# Patient Record
Sex: Female | Born: 1960 | Race: Black or African American | Hispanic: No | Marital: Married
Health system: Southern US, Community
[De-identification: ages and names within clinical notes are randomized; demographics above are authoritative.]

---

## 1998-10-11 ENCOUNTER — Emergency Department (HOSPITAL_COMMUNITY): Admission: EM | Admit: 1998-10-11 | Discharge: 1998-10-11 | Payer: Self-pay | Admitting: Emergency Medicine

## 2000-10-20 ENCOUNTER — Other Ambulatory Visit: Admission: RE | Admit: 2000-10-20 | Discharge: 2000-10-20 | Payer: Self-pay | Admitting: Gynecology

## 2002-07-12 ENCOUNTER — Encounter: Payer: Self-pay | Admitting: Gynecology

## 2002-07-12 ENCOUNTER — Encounter: Admission: RE | Admit: 2002-07-12 | Discharge: 2002-07-12 | Payer: Self-pay | Admitting: Gynecology

## 2002-10-21 ENCOUNTER — Encounter: Payer: Self-pay | Admitting: Emergency Medicine

## 2002-10-21 ENCOUNTER — Emergency Department (HOSPITAL_COMMUNITY): Admission: EM | Admit: 2002-10-21 | Discharge: 2002-10-21 | Payer: Self-pay | Admitting: Emergency Medicine

## 2003-11-01 ENCOUNTER — Emergency Department (HOSPITAL_COMMUNITY): Admission: EM | Admit: 2003-11-01 | Discharge: 2003-11-01 | Payer: Self-pay | Admitting: *Deleted

## 2004-04-06 ENCOUNTER — Encounter: Admission: RE | Admit: 2004-04-06 | Discharge: 2004-04-06 | Payer: Self-pay | Admitting: Family Medicine

## 2004-07-06 ENCOUNTER — Encounter: Admission: RE | Admit: 2004-07-06 | Discharge: 2004-07-06 | Payer: Self-pay | Admitting: Family Medicine

## 2007-09-18 ENCOUNTER — Other Ambulatory Visit: Admission: RE | Admit: 2007-09-18 | Discharge: 2007-09-18 | Payer: Self-pay | Admitting: Family Medicine

## 2009-07-30 ENCOUNTER — Inpatient Hospital Stay (HOSPITAL_COMMUNITY): Admission: AD | Admit: 2009-07-30 | Discharge: 2009-07-30 | Payer: Self-pay | Admitting: Family Medicine

## 2009-07-30 ENCOUNTER — Other Ambulatory Visit: Payer: Self-pay | Admitting: Emergency Medicine

## 2010-07-27 ENCOUNTER — Encounter
Admission: RE | Admit: 2010-07-27 | Discharge: 2010-07-27 | Payer: Self-pay | Source: Home / Self Care | Attending: Family Medicine | Admitting: Family Medicine

## 2010-09-08 ENCOUNTER — Other Ambulatory Visit: Payer: Self-pay | Admitting: Gynecology

## 2010-11-09 LAB — DIFFERENTIAL
Basophils Absolute: 0 10*3/uL (ref 0.0–0.1)
Basophils Relative: 1 % (ref 0–1)
Eosinophils Absolute: 0 10*3/uL (ref 0.0–0.7)
Eosinophils Relative: 0 % (ref 0–5)
Lymphocytes Relative: 8 % — ABNORMAL LOW (ref 12–46)
Monocytes Absolute: 0.4 10*3/uL (ref 0.1–1.0)

## 2010-11-09 LAB — COMPREHENSIVE METABOLIC PANEL
ALT: 16 U/L (ref 0–35)
AST: 18 U/L (ref 0–37)
Alkaline Phosphatase: 44 U/L (ref 39–117)
CO2: 25 mEq/L (ref 19–32)
Calcium: 8.8 mg/dL (ref 8.4–10.5)
Chloride: 104 mEq/L (ref 96–112)
GFR calc non Af Amer: >60 mL/min (ref >60)
Glucose, Bld: 108 mg/dL — ABNORMAL HIGH (ref 70–99)
Sodium: 136 mEq/L (ref 135–145)
Total Bilirubin: 0.6 mg/dL (ref 0.3–1.2)

## 2010-11-09 LAB — CBC
MCV: 91.1 fL (ref 78.0–100.0)
Platelets: 328 10*3/uL (ref 150–400)
RBC: 3.96 MIL/uL (ref 3.87–5.11)
WBC: 6.4 10*3/uL (ref 4.0–10.5)

## 2010-11-09 LAB — POCT PREGNANCY, URINE

## 2010-11-09 LAB — URINALYSIS, ROUTINE W REFLEX MICROSCOPIC
Glucose, UA: NEGATIVE mg/dL
Ketones, ur: >80 mg/dL — AB
Protein, ur: NEGATIVE mg/dL
Urobilinogen, UA: 1 mg/dL (ref 0.0–1.0)

## 2010-11-09 LAB — GC/CHLAMYDIA PROBE AMP, GENITAL

## 2010-11-09 LAB — WET PREP, GENITAL
Clue Cells Wet Prep HPF POC: NONE SEEN
Trich, Wet Prep: NONE SEEN
WBC, Wet Prep HPF POC: NONE SEEN

## 2010-11-09 LAB — LIPASE, BLOOD: Lipase: 25 U/L (ref 11–59)

## 2011-07-27 ENCOUNTER — Other Ambulatory Visit: Payer: Self-pay | Admitting: Family Medicine

## 2011-07-27 DIAGNOSIS — Z1231 Encounter for screening mammogram for malignant neoplasm of breast: Secondary | ICD-10-CM

## 2011-08-04 ENCOUNTER — Ambulatory Visit
Admission: RE | Admit: 2011-08-04 | Discharge: 2011-08-04 | Disposition: A | Discharge status: Home / Self Care | Payer: Private Health Insurance - Indemnity | Source: Ambulatory Visit | Attending: Family Medicine | Admitting: Family Medicine

## 2011-08-04 DIAGNOSIS — Z1231 Encounter for screening mammogram for malignant neoplasm of breast: Secondary | ICD-10-CM

## 2011-12-28 ENCOUNTER — Other Ambulatory Visit: Payer: Self-pay | Admitting: Gynecology

## 2012-08-07 ENCOUNTER — Other Ambulatory Visit: Payer: Self-pay | Admitting: Family Medicine

## 2012-08-07 DIAGNOSIS — Z1231 Encounter for screening mammogram for malignant neoplasm of breast: Secondary | ICD-10-CM

## 2012-09-04 ENCOUNTER — Ambulatory Visit
Admission: RE | Admit: 2012-09-04 | Discharge: 2012-09-04 | Disposition: A | Discharge status: Home / Self Care | Payer: Private Health Insurance - Indemnity | Source: Ambulatory Visit | Attending: Family Medicine | Admitting: Family Medicine

## 2012-09-04 DIAGNOSIS — Z1231 Encounter for screening mammogram for malignant neoplasm of breast: Secondary | ICD-10-CM

## 2013-11-29 ENCOUNTER — Other Ambulatory Visit (HOSPITAL_COMMUNITY)
Admission: RE | Admit: 2013-11-29 | Discharge: 2013-11-29 | Disposition: A | Discharge status: Home / Self Care | Payer: BC Managed Care – PPO | Source: Ambulatory Visit | Attending: Family Medicine | Admitting: Family Medicine

## 2013-11-29 ENCOUNTER — Other Ambulatory Visit: Payer: Self-pay | Admitting: Family Medicine

## 2013-11-29 DIAGNOSIS — Z124 Encounter for screening for malignant neoplasm of cervix: Secondary | ICD-10-CM | POA: Insufficient documentation

## 2014-01-22 ENCOUNTER — Other Ambulatory Visit: Payer: Self-pay

## 2014-01-22 DIAGNOSIS — Z1231 Encounter for screening mammogram for malignant neoplasm of breast: Secondary | ICD-10-CM

## 2014-03-28 ENCOUNTER — Ambulatory Visit: Payer: Private Health Insurance - Indemnity

## 2014-04-30 ENCOUNTER — Ambulatory Visit: Payer: Private Health Insurance - Indemnity

## 2014-06-26 ENCOUNTER — Ambulatory Visit: Payer: Private Health Insurance - Indemnity

## 2014-09-11 ENCOUNTER — Ambulatory Visit
Admission: RE | Admit: 2014-09-11 | Discharge: 2014-09-11 | Disposition: A | Discharge status: Home / Self Care | Payer: BLUE CROSS/BLUE SHIELD | Source: Ambulatory Visit

## 2014-09-11 DIAGNOSIS — Z1231 Encounter for screening mammogram for malignant neoplasm of breast: Secondary | ICD-10-CM

## 2015-11-18 ENCOUNTER — Other Ambulatory Visit: Payer: Self-pay

## 2015-11-18 DIAGNOSIS — Z1231 Encounter for screening mammogram for malignant neoplasm of breast: Secondary | ICD-10-CM

## 2015-12-10 ENCOUNTER — Ambulatory Visit: Payer: BLUE CROSS/BLUE SHIELD

## 2015-12-17 ENCOUNTER — Ambulatory Visit
Admission: RE | Admit: 2015-12-17 | Discharge: 2015-12-17 | Disposition: A | Discharge status: Home / Self Care | Payer: BLUE CROSS/BLUE SHIELD | Source: Ambulatory Visit

## 2015-12-17 DIAGNOSIS — Z1231 Encounter for screening mammogram for malignant neoplasm of breast: Secondary | ICD-10-CM

## 2016-04-02 DIAGNOSIS — R42 Dizziness and giddiness: Secondary | ICD-10-CM | POA: Diagnosis not present

## 2016-04-02 DIAGNOSIS — I10 Essential (primary) hypertension: Secondary | ICD-10-CM | POA: Diagnosis not present

## 2016-10-05 DIAGNOSIS — I10 Essential (primary) hypertension: Secondary | ICD-10-CM | POA: Diagnosis not present

## 2016-10-05 DIAGNOSIS — Z0000 Encounter for general adult medical examination without abnormal findings: Secondary | ICD-10-CM | POA: Diagnosis not present

## 2017-01-14 DIAGNOSIS — Z1211 Encounter for screening for malignant neoplasm of colon: Secondary | ICD-10-CM | POA: Diagnosis not present

## 2017-01-14 DIAGNOSIS — K6389 Other specified diseases of intestine: Secondary | ICD-10-CM | POA: Diagnosis not present

## 2017-01-14 DIAGNOSIS — K640 First degree hemorrhoids: Secondary | ICD-10-CM | POA: Diagnosis not present

## 2017-02-22 ENCOUNTER — Other Ambulatory Visit: Payer: Self-pay | Admitting: Family Medicine

## 2017-02-22 DIAGNOSIS — Z1231 Encounter for screening mammogram for malignant neoplasm of breast: Secondary | ICD-10-CM

## 2017-03-29 ENCOUNTER — Ambulatory Visit
Admission: RE | Admit: 2017-03-29 | Discharge: 2017-03-29 | Disposition: A | Discharge status: Home / Self Care | Payer: BLUE CROSS/BLUE SHIELD | Source: Ambulatory Visit | Attending: Family Medicine | Admitting: Family Medicine

## 2017-03-29 DIAGNOSIS — Z1231 Encounter for screening mammogram for malignant neoplasm of breast: Secondary | ICD-10-CM

## 2017-07-08 DIAGNOSIS — Z23 Encounter for immunization: Secondary | ICD-10-CM | POA: Diagnosis not present

## 2017-10-06 ENCOUNTER — Other Ambulatory Visit: Payer: Self-pay | Admitting: Family Medicine

## 2017-10-06 ENCOUNTER — Other Ambulatory Visit (HOSPITAL_COMMUNITY)
Admission: RE | Admit: 2017-10-06 | Discharge: 2017-10-06 | Disposition: A | Discharge status: Home / Self Care | Payer: BLUE CROSS/BLUE SHIELD | Source: Ambulatory Visit | Attending: Family Medicine | Admitting: Family Medicine

## 2017-10-06 DIAGNOSIS — Z0000 Encounter for general adult medical examination without abnormal findings: Secondary | ICD-10-CM | POA: Diagnosis not present

## 2017-10-06 DIAGNOSIS — I10 Essential (primary) hypertension: Secondary | ICD-10-CM | POA: Diagnosis not present

## 2017-10-06 DIAGNOSIS — Z124 Encounter for screening for malignant neoplasm of cervix: Secondary | ICD-10-CM | POA: Diagnosis not present

## 2017-10-10 LAB — CYTOLOGY - PAP: Diagnosis: NEGATIVE

## 2018-04-11 ENCOUNTER — Other Ambulatory Visit: Payer: Self-pay | Admitting: Family Medicine

## 2018-04-11 DIAGNOSIS — Z1231 Encounter for screening mammogram for malignant neoplasm of breast: Secondary | ICD-10-CM

## 2018-05-08 ENCOUNTER — Other Ambulatory Visit: Payer: Self-pay | Admitting: Family Medicine

## 2018-05-08 ENCOUNTER — Ambulatory Visit
Admission: RE | Admit: 2018-05-08 | Discharge: 2018-05-08 | Disposition: A | Discharge status: Home / Self Care | Payer: BLUE CROSS/BLUE SHIELD | Source: Ambulatory Visit | Attending: Family Medicine | Admitting: Family Medicine

## 2018-05-08 DIAGNOSIS — Z1231 Encounter for screening mammogram for malignant neoplasm of breast: Secondary | ICD-10-CM | POA: Diagnosis not present

## 2018-07-11 DIAGNOSIS — Z23 Encounter for immunization: Secondary | ICD-10-CM | POA: Diagnosis not present

## 2018-08-14 ENCOUNTER — Emergency Department (HOSPITAL_COMMUNITY): Payer: No Typology Code available for payment source

## 2018-08-14 ENCOUNTER — Emergency Department (HOSPITAL_COMMUNITY)
Admission: EM | Admit: 2018-08-14 | Discharge: 2018-08-14 | Disposition: A | Discharge status: Home / Self Care | Payer: No Typology Code available for payment source | Attending: Emergency Medicine | Admitting: Emergency Medicine

## 2018-08-14 ENCOUNTER — Encounter (HOSPITAL_COMMUNITY): Payer: Self-pay | Admitting: *Deleted

## 2018-08-14 DIAGNOSIS — M791 Myalgia, unspecified site: Secondary | ICD-10-CM | POA: Diagnosis not present

## 2018-08-14 DIAGNOSIS — M549 Dorsalgia, unspecified: Secondary | ICD-10-CM

## 2018-08-14 DIAGNOSIS — M545 Low back pain: Secondary | ICD-10-CM | POA: Diagnosis not present

## 2018-08-14 DIAGNOSIS — S3992XA Unspecified injury of lower back, initial encounter: Secondary | ICD-10-CM | POA: Diagnosis not present

## 2018-08-14 MED ORDER — METHOCARBAMOL 500 MG PO TABS: 500.0000 mg | ORAL_TABLET | Freq: Two times a day (BID) | ORAL | 0 refills | Status: AC
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
## 2018-08-14 NOTE — ED Notes (Signed)
ED Provider at bedside. 
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
## 2018-08-14 NOTE — ED Notes (Signed)
Patient transported to X-ray 

## 2018-08-14 NOTE — Discharge Instructions (Signed)
Please read attached information. If you experience any new or worsening signs or symptoms please return to the emergency room for evaluation. Please follow-up with your primary care provider or specialist as discussed. Please use medication prescribed only as directed and discontinue taking if you have any concerning signs or symptoms.   °

## 2018-10-10 DIAGNOSIS — Z0000 Encounter for general adult medical examination without abnormal findings: Secondary | ICD-10-CM | POA: Diagnosis not present

## 2018-10-10 DIAGNOSIS — Z1159 Encounter for screening for other viral diseases: Secondary | ICD-10-CM | POA: Diagnosis not present

## 2018-10-10 DIAGNOSIS — I10 Essential (primary) hypertension: Secondary | ICD-10-CM | POA: Diagnosis not present

## 2019-01-29 ENCOUNTER — Other Ambulatory Visit: Payer: Self-pay | Admitting: *Deleted

## 2019-01-29 DIAGNOSIS — Z20822 Contact with and (suspected) exposure to covid-19: Secondary | ICD-10-CM

## 2019-02-05 ENCOUNTER — Telehealth: Payer: Self-pay

## 2019-02-05 LAB — NOVEL CORONAVIRUS, NAA: SARS-CoV-2, NAA: NOT DETECTED

## 2019-02-05 NOTE — Telephone Encounter (Signed)
Pt. Called - given negative COVID 19 results.

## 2019-04-18 ENCOUNTER — Other Ambulatory Visit: Payer: Self-pay | Admitting: Family Medicine

## 2019-04-18 DIAGNOSIS — Z1231 Encounter for screening mammogram for malignant neoplasm of breast: Secondary | ICD-10-CM

## 2019-06-02 DIAGNOSIS — Z23 Encounter for immunization: Secondary | ICD-10-CM | POA: Diagnosis not present

## 2019-06-04 ENCOUNTER — Ambulatory Visit: Payer: BLUE CROSS/BLUE SHIELD

## 2019-06-08 ENCOUNTER — Ambulatory Visit: Payer: BLUE CROSS/BLUE SHIELD

## 2019-06-12 ENCOUNTER — Ambulatory Visit
Admission: RE | Admit: 2019-06-12 | Discharge: 2019-06-12 | Disposition: A | Discharge status: Home / Self Care | Payer: BC Managed Care – PPO | Source: Ambulatory Visit | Attending: Family Medicine | Admitting: Family Medicine

## 2019-06-12 ENCOUNTER — Other Ambulatory Visit: Payer: Self-pay

## 2019-06-12 DIAGNOSIS — Z1231 Encounter for screening mammogram for malignant neoplasm of breast: Secondary | ICD-10-CM

## 2019-06-14 ENCOUNTER — Other Ambulatory Visit: Payer: Self-pay | Admitting: Family Medicine

## 2019-06-14 DIAGNOSIS — N63 Unspecified lump in unspecified breast: Secondary | ICD-10-CM

## 2019-06-28 ENCOUNTER — Other Ambulatory Visit: Payer: Self-pay | Admitting: Cardiology

## 2019-06-28 DIAGNOSIS — Z20822 Contact with and (suspected) exposure to covid-19: Secondary | ICD-10-CM

## 2019-07-02 LAB — NOVEL CORONAVIRUS, NAA: SARS-CoV-2, NAA: NOT DETECTED

## 2019-07-03 ENCOUNTER — Ambulatory Visit
Admission: RE | Admit: 2019-07-03 | Discharge: 2019-07-03 | Disposition: A | Discharge status: Home / Self Care | Payer: BC Managed Care – PPO | Source: Ambulatory Visit | Attending: Family Medicine | Admitting: Family Medicine

## 2019-07-03 ENCOUNTER — Other Ambulatory Visit: Payer: Self-pay

## 2019-07-03 DIAGNOSIS — N6489 Other specified disorders of breast: Secondary | ICD-10-CM | POA: Diagnosis not present

## 2019-07-03 DIAGNOSIS — N63 Unspecified lump in unspecified breast: Secondary | ICD-10-CM

## 2019-07-03 DIAGNOSIS — N6310 Unspecified lump in the right breast, unspecified quadrant: Secondary | ICD-10-CM | POA: Diagnosis not present

## 2019-07-03 DIAGNOSIS — R928 Other abnormal and inconclusive findings on diagnostic imaging of breast: Secondary | ICD-10-CM | POA: Diagnosis not present

## 2019-07-26 ENCOUNTER — Other Ambulatory Visit: Payer: Self-pay | Admitting: Cardiology

## 2019-07-26 DIAGNOSIS — Z20828 Contact with and (suspected) exposure to other viral communicable diseases: Secondary | ICD-10-CM | POA: Diagnosis not present

## 2019-07-26 DIAGNOSIS — Z20822 Contact with and (suspected) exposure to covid-19: Secondary | ICD-10-CM

## 2019-07-28 LAB — NOVEL CORONAVIRUS, NAA: SARS-CoV-2, NAA: NOT DETECTED

## 2019-08-18 ENCOUNTER — Other Ambulatory Visit: Payer: Self-pay

## 2019-08-18 DIAGNOSIS — Z20822 Contact with and (suspected) exposure to covid-19: Secondary | ICD-10-CM | POA: Diagnosis not present

## 2019-08-19 LAB — NOVEL CORONAVIRUS, NAA: SARS-CoV-2, NAA: NOT DETECTED

## 2019-10-11 DIAGNOSIS — Z0000 Encounter for general adult medical examination without abnormal findings: Secondary | ICD-10-CM | POA: Diagnosis not present

## 2019-10-11 DIAGNOSIS — I10 Essential (primary) hypertension: Secondary | ICD-10-CM | POA: Diagnosis not present

## 2020-04-02 IMAGING — MG DIGITAL DIAGNOSTIC BILAT W/ TOMO W/ CAD
8 of 17 series · 8 of 40 positions shown · non-contrast
Comparison: Previous exam(s).

CLINICAL DATA: 57-year-old female with palpable lump in the INNER
RIGHT breast discovered on self-examination. Also for annual
bilateral mammogram.

EXAM:
DIGITAL DIAGNOSTIC BILATERAL MAMMOGRAM WITH CAD AND TOMO
ULTRASOUND RIGHT BREAST

[R CC synth-2D (1 of 2)]
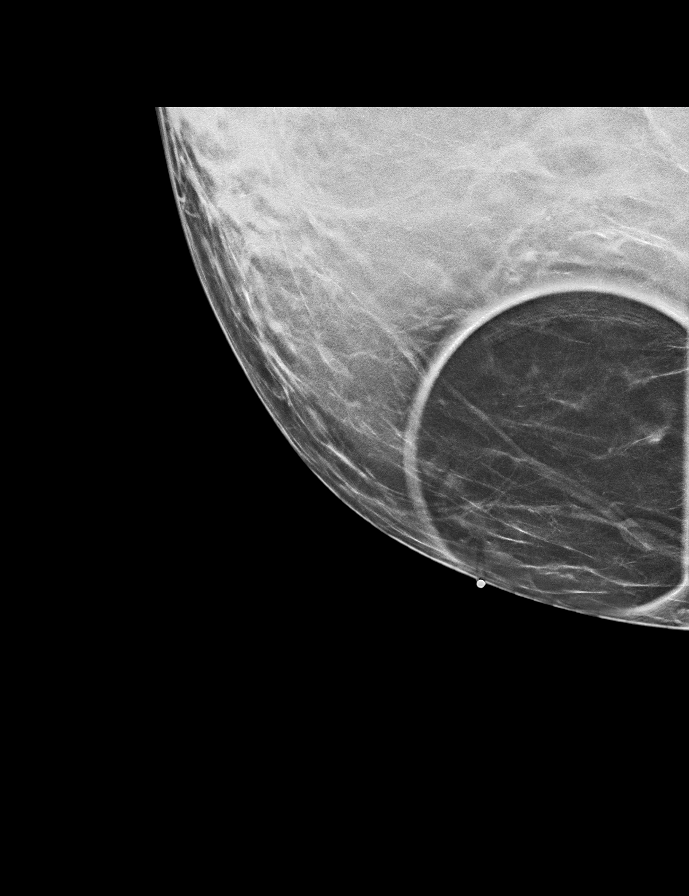

[L CV synth-2D]
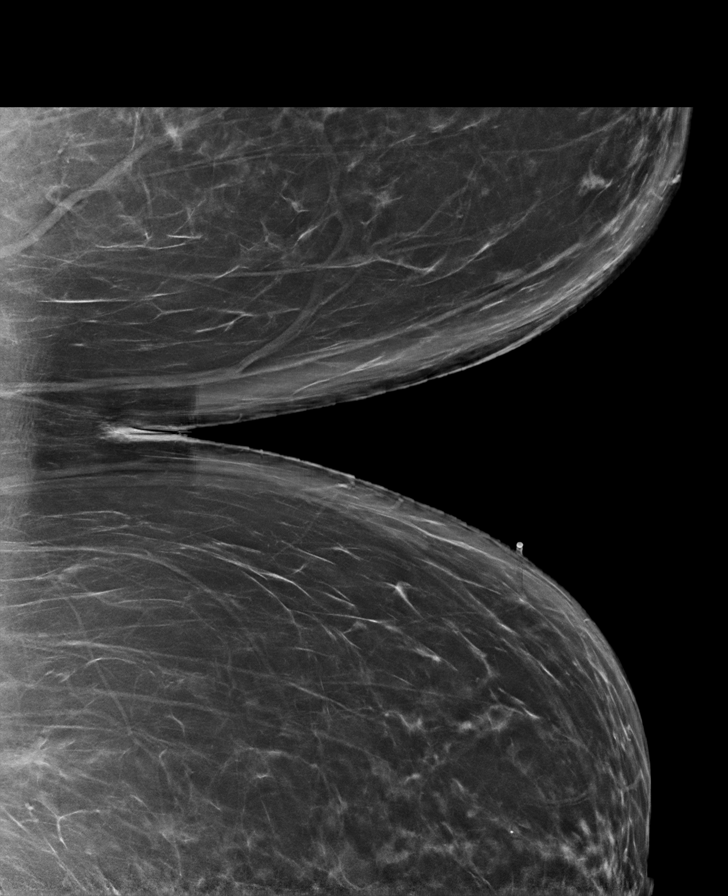

[R CC synth-2D (2 of 2)]
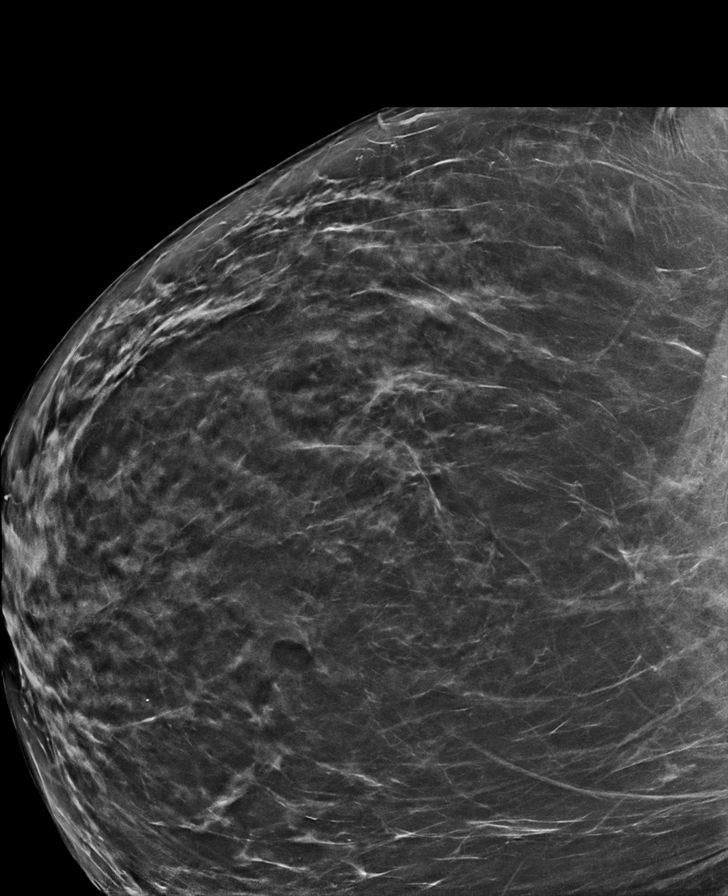

[L MLO synth-2D]
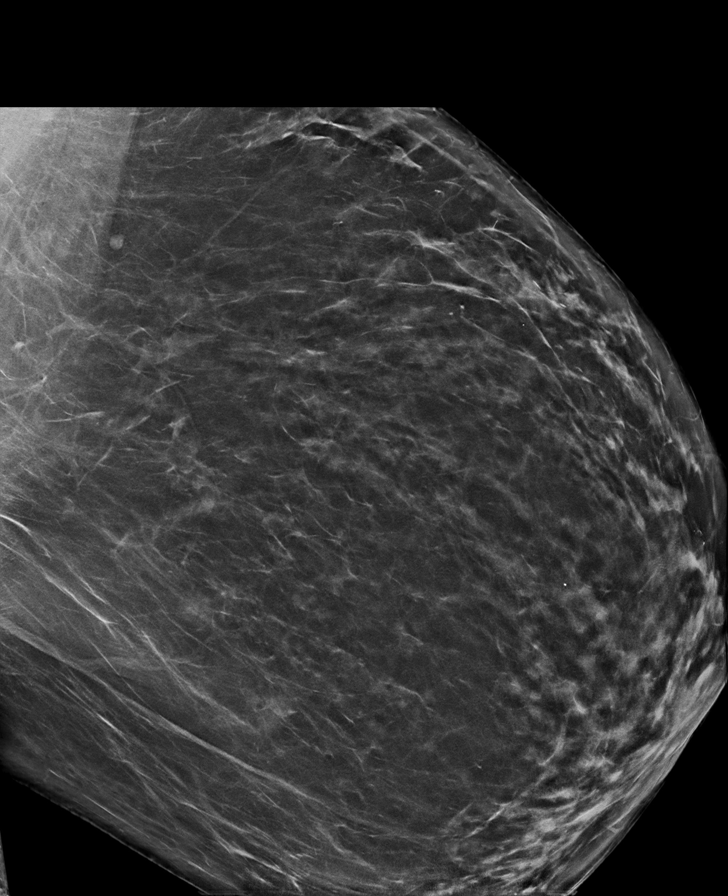

[L CC synth-2D (1 of 2)]
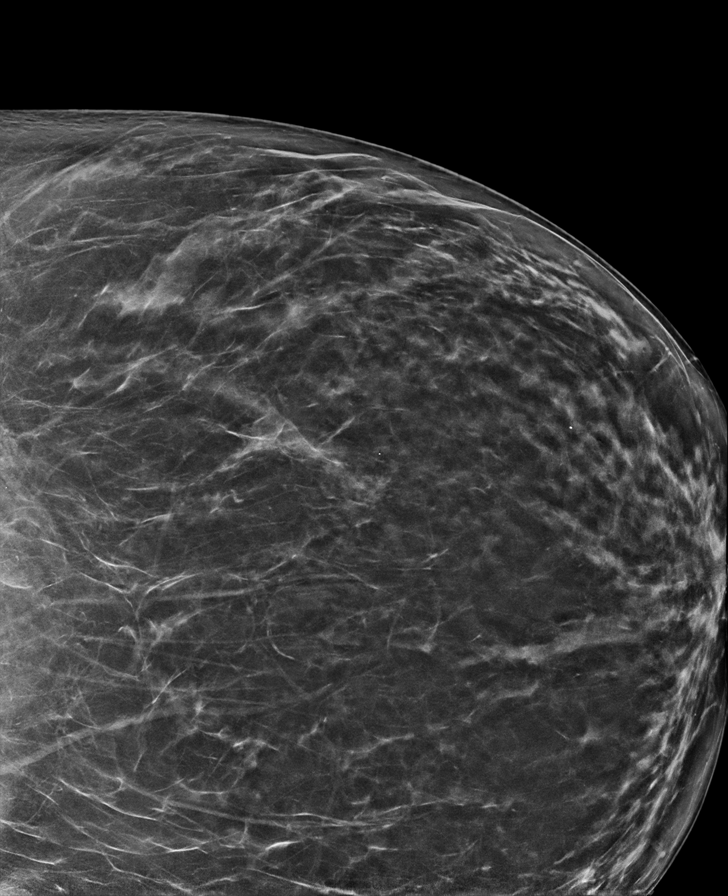

[L CC synth-2D (2 of 2)]
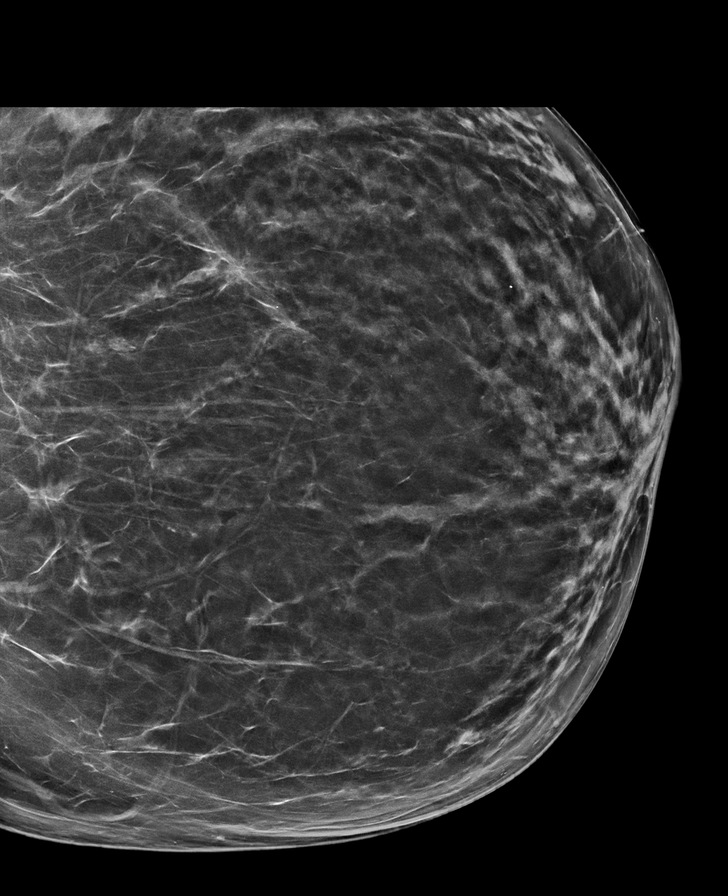

[R MLO synth-2D]
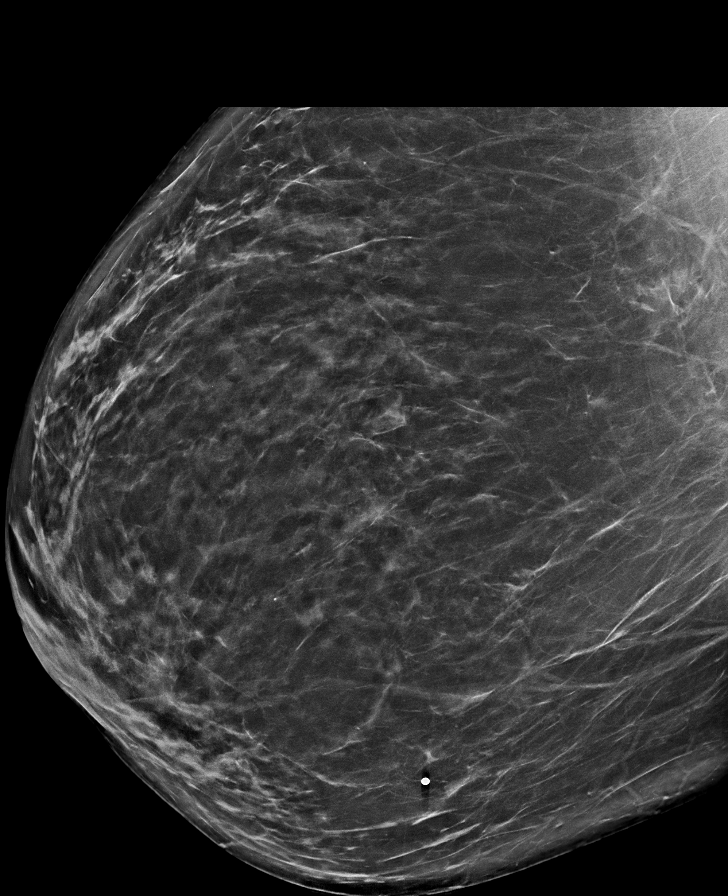

[R CC tomo · tomo slice 43/86.0]
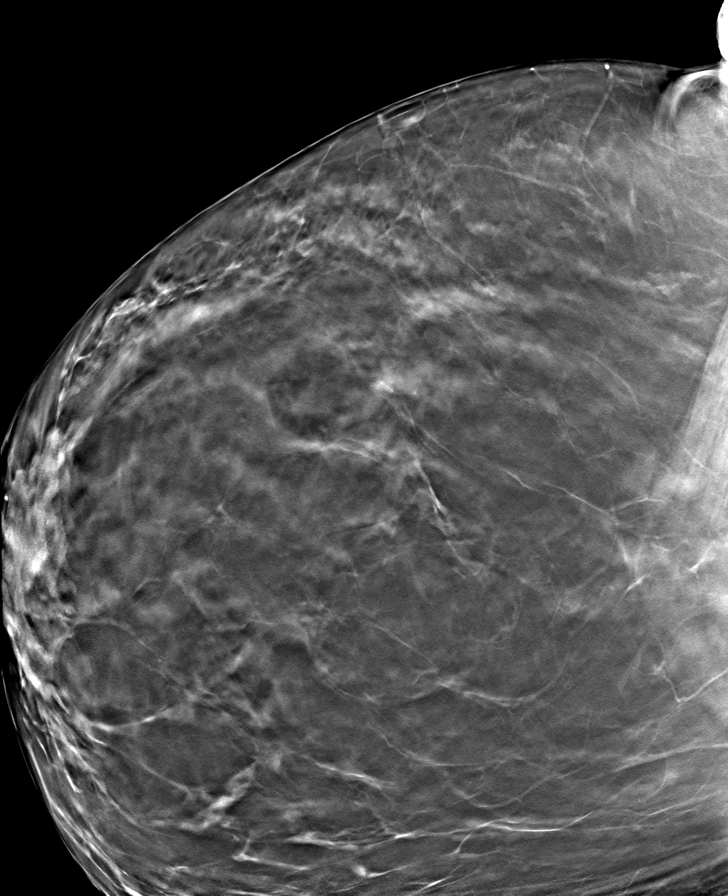

[8 of 40 positions shown; findings below may reference images not displayed]

ACR Breast Density Category b: There are scattered areas of
fibroglandular density.
FINDINGS: 2D/3D full field views of both breasts demonstrate no suspicious
mass, distortion or worrisome calcifications.

Mammographic images were processed with CAD.

On physical exam, no suspicious palpable abnormalities are
identified within the INNER RIGHT breast.

Targeted ultrasound is performed, showing no solid or cystic mass,
distortion or abnormal shadowing within the INNER RIGHT breast, in
the area of patient concern.
IMPRESSION: 1. No mammographic, suspicious palpable or sonographic abnormality
within the INNER RIGHT breast, in the area of patient concern.
2. No mammographic evidence of breast malignancy.

RECOMMENDATION:
Bilateral screening mammogram in 1 year.

I have discussed the findings and recommendations with the patient.
If applicable, a reminder letter will be sent to the patient
regarding the next appointment.

BI-RADS CATEGORY  1: Negative.

## 2020-04-18 ENCOUNTER — Other Ambulatory Visit: Payer: Self-pay

## 2020-04-18 DIAGNOSIS — Z20822 Contact with and (suspected) exposure to covid-19: Secondary | ICD-10-CM

## 2020-04-21 LAB — NOVEL CORONAVIRUS, NAA: SARS-CoV-2, NAA: NOT DETECTED

## 2020-05-19 ENCOUNTER — Other Ambulatory Visit: Payer: Self-pay | Admitting: Family Medicine

## 2020-05-19 DIAGNOSIS — Z1231 Encounter for screening mammogram for malignant neoplasm of breast: Secondary | ICD-10-CM

## 2020-06-19 ENCOUNTER — Ambulatory Visit: Payer: No Typology Code available for payment source | Attending: Critical Care Medicine

## 2020-06-19 DIAGNOSIS — Z23 Encounter for immunization: Secondary | ICD-10-CM

## 2020-06-19 NOTE — Progress Notes (Signed)
   Covid-19 Vaccination Clinic  Name:  Amy Holden    MRN: [id number] DOB: 08/14/1960  06/19/2020  Amy Holden was observed post Covid-19 immunization for 15 minutes without incident. She was provided with Vaccine Information Sheet and instruction to access the V-Safe system.   Amy Holden was instructed to call 911 with any severe reactions post vaccine: Marland Kitchen Difficulty breathing  . Swelling of face and throat  . A fast heartbeat  . A bad rash all over body  . Dizziness and weakness

## 2020-07-17 ENCOUNTER — Ambulatory Visit: Payer: BC Managed Care – PPO

## 2020-08-13 ENCOUNTER — Other Ambulatory Visit: Payer: Self-pay

## 2020-08-13 ENCOUNTER — Ambulatory Visit
Admission: RE | Admit: 2020-08-13 | Discharge: 2020-08-13 | Disposition: A | Discharge status: Home / Self Care | Payer: BC Managed Care – PPO | Source: Ambulatory Visit | Attending: Family Medicine | Admitting: Family Medicine

## 2020-08-13 DIAGNOSIS — Z1231 Encounter for screening mammogram for malignant neoplasm of breast: Secondary | ICD-10-CM

## 2021-08-12 ENCOUNTER — Other Ambulatory Visit: Payer: Self-pay | Admitting: Family Medicine

## 2021-08-12 DIAGNOSIS — Z1231 Encounter for screening mammogram for malignant neoplasm of breast: Secondary | ICD-10-CM

## 2021-09-01 ENCOUNTER — Ambulatory Visit
Admission: RE | Admit: 2021-09-01 | Discharge: 2021-09-01 | Disposition: A | Discharge status: Home / Self Care | Payer: BC Managed Care – PPO | Source: Ambulatory Visit | Attending: Family Medicine | Admitting: Family Medicine

## 2021-09-01 DIAGNOSIS — Z1231 Encounter for screening mammogram for malignant neoplasm of breast: Secondary | ICD-10-CM

## 2022-08-17 ENCOUNTER — Other Ambulatory Visit: Payer: Self-pay | Admitting: Family Medicine

## 2022-08-17 DIAGNOSIS — Z1231 Encounter for screening mammogram for malignant neoplasm of breast: Secondary | ICD-10-CM

## 2022-10-07 ENCOUNTER — Ambulatory Visit
Admission: RE | Admit: 2022-10-07 | Discharge: 2022-10-07 | Disposition: A | Discharge status: Home / Self Care | Payer: BC Managed Care – PPO | Source: Ambulatory Visit | Attending: Family Medicine | Admitting: Family Medicine

## 2022-10-07 DIAGNOSIS — Z1231 Encounter for screening mammogram for malignant neoplasm of breast: Secondary | ICD-10-CM

## 2023-09-06 ENCOUNTER — Other Ambulatory Visit: Payer: Self-pay | Admitting: Internal Medicine

## 2023-09-06 DIAGNOSIS — Z1231 Encounter for screening mammogram for malignant neoplasm of breast: Secondary | ICD-10-CM

## 2023-10-11 ENCOUNTER — Ambulatory Visit
Admission: RE | Admit: 2023-10-11 | Discharge: 2023-10-11 | Disposition: A | Discharge status: Home / Self Care | Payer: BC Managed Care – PPO | Source: Ambulatory Visit | Attending: Internal Medicine | Admitting: Internal Medicine

## 2023-10-11 DIAGNOSIS — Z1231 Encounter for screening mammogram for malignant neoplasm of breast: Secondary | ICD-10-CM
